# Patient Record
Sex: Female | Born: 1995 | Race: White | Hispanic: No | Marital: Single | Smoking: Current every day smoker
Health system: Southern US, Community
[De-identification: ages and names within clinical notes are randomized; demographics above are authoritative.]

## PROBLEM LIST (undated history)

## (undated) DIAGNOSIS — Q249 Congenital malformation of heart, unspecified: Secondary | ICD-10-CM

## (undated) DIAGNOSIS — M419 Scoliosis, unspecified: Secondary | ICD-10-CM

## (undated) DIAGNOSIS — G43909 Migraine, unspecified, not intractable, without status migrainosus: Secondary | ICD-10-CM

## (undated) DIAGNOSIS — E559 Vitamin D deficiency, unspecified: Secondary | ICD-10-CM

## (undated) DIAGNOSIS — M26609 Unspecified temporomandibular joint disorder, unspecified side: Secondary | ICD-10-CM

## (undated) DIAGNOSIS — L309 Dermatitis, unspecified: Secondary | ICD-10-CM

## (undated) DIAGNOSIS — F419 Anxiety disorder, unspecified: Secondary | ICD-10-CM

## (undated) DIAGNOSIS — N390 Urinary tract infection, site not specified: Secondary | ICD-10-CM

## (undated) DIAGNOSIS — L709 Acne, unspecified: Secondary | ICD-10-CM

## (undated) HISTORY — DX: Scoliosis, unspecified: M41.9

## (undated) HISTORY — DX: Unspecified temporomandibular joint disorder, unspecified side: M26.609

## (undated) HISTORY — DX: Vitamin D deficiency, unspecified: E55.9

## (undated) HISTORY — DX: Dermatitis, unspecified: L30.9

## (undated) HISTORY — DX: Migraine, unspecified, not intractable, without status migrainosus: G43.909

## (undated) HISTORY — DX: Urinary tract infection, site not specified: N39.0

## (undated) HISTORY — PX: CARDIAC SURGERY: SHX584

## (undated) HISTORY — PX: CARDIAC CATHETERIZATION: SHX172

## (undated) HISTORY — DX: Anxiety disorder, unspecified: F41.9

## (undated) HISTORY — DX: Congenital malformation of heart, unspecified: Q24.9

## (undated) HISTORY — DX: Acne, unspecified: L70.9

## (undated) HISTORY — PX: BACK SURGERY: SHX140

---
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
## 2019-01-21 ENCOUNTER — Other Ambulatory Visit (HOSPITAL_COMMUNITY): Payer: Self-pay | Attending: Psychiatry | Admitting: Licensed Clinical Social Worker

## 2019-01-21 ENCOUNTER — Other Ambulatory Visit: Payer: Self-pay

## 2019-01-21 DIAGNOSIS — F331 Major depressive disorder, recurrent, moderate: Secondary | ICD-10-CM

## 2019-01-23 NOTE — Psych (Signed)
Angela Marsh is a 23 y.o. female patient presenting for intensive treatment. Cln met with pt to determine if intensive treatment is recommended and which group would be best.  Pt states her therapist, Gwen Awel, recommended she engage in intensive treatment to address increasing depression. Pt states "I think it was just a bad day when she saw me." Pt shares she had just gotten back from a spring break trip to the beach with her friends and was experiencing post-vacation slump. Pt reports "the weird thing is, I've felt a lot better since she told me I needed this." Pt reports depression has been increasing for the past 6 months - 1 year due to being diagnosed with TMJ. Pt reports stressors of pain from TMJ, school,  and her family. Pt reports struggling in college and moving from [facility] to [place]. Pt states feeling like a failure because she is not as high achieving as other family members. Pt reports smoking marijuana and drinking alcohol. Pt states this is and is not problematic, switching throughout the discussion. Pt reports currently smoking 1/2 a blunt daily and 1 beer 3x/week. Pt states she does not want to stop. Pt denies self harm, stating she cut one time in middle school and never has again. Pt reports some passive SI over the past 6 months, the last time one month ago, and denies ever having intent or making a plan. Pt shares she feels her social anxiety is one of her biggest concerns and that she only has 1-3 friends which is not enough.  Pt reports "I don't know if I need this. I think I might be fine" however states she is willing to try anything. Cln educated pt on PHP, IOP, and CDIOP. Pt declines CDIOP stating she does not want to stop using. Pt declines PHP stating "I don't need that much" and reports interest in IOP.  Cln educates pt on the basics of IOP and gives pt start date of 01/24/19. Pt denies SI/HI/AVH.      Lorin Glass, LCSW

## 2019-03-13 ENCOUNTER — Telehealth: Payer: Self-pay | Admitting: *Deleted

## 2019-03-13 NOTE — Telephone Encounter (Signed)
Called pt & LVM asking for call back before her appt tomorrow with Dr. Jaynee Eagles. Left office number in message.   Need to update her chart.

## 2019-03-14 ENCOUNTER — Ambulatory Visit (INDEPENDENT_AMBULATORY_CARE_PROVIDER_SITE_OTHER): Payer: 59 | Admitting: Neurology

## 2019-03-14 ENCOUNTER — Encounter: Payer: Self-pay | Admitting: *Deleted

## 2019-03-14 ENCOUNTER — Other Ambulatory Visit: Payer: Self-pay

## 2019-03-14 DIAGNOSIS — G500 Trigeminal neuralgia: Secondary | ICD-10-CM | POA: Diagnosis not present

## 2019-03-14 DIAGNOSIS — G43109 Migraine with aura, not intractable, without status migrainosus: Secondary | ICD-10-CM | POA: Diagnosis not present

## 2019-03-14 DIAGNOSIS — R51 Headache: Secondary | ICD-10-CM

## 2019-03-14 DIAGNOSIS — R202 Paresthesia of skin: Secondary | ICD-10-CM

## 2019-03-14 DIAGNOSIS — R519 Headache, unspecified: Secondary | ICD-10-CM

## 2019-03-14 DIAGNOSIS — G509 Disorder of trigeminal nerve, unspecified: Secondary | ICD-10-CM

## 2019-03-14 DIAGNOSIS — R200 Anesthesia of skin: Secondary | ICD-10-CM

## 2019-03-14 DIAGNOSIS — H5461 Unqualified visual loss, right eye, normal vision left eye: Secondary | ICD-10-CM | POA: Diagnosis not present

## 2019-03-14 NOTE — Addendum Note (Signed)
Addended by: Gildardo Griffes on: 03/14/2019 11:55 AM   Modules accepted: Orders

## 2019-03-14 NOTE — Telephone Encounter (Signed)
Pt returned my call. Confirmed pt using 2 identifiers. Pt's chart updated including PMH, social documentation, medications. Pt understands she will receive a call to check in 30 minutes prior to appointment.

## 2019-03-14 NOTE — Progress Notes (Addendum)
[facility]    Provider:  Dr Jaynee Eagles Requesting Provider: Rodney Booze, MD Primary Care Provider:  Rodney Booze, MD  CC:  Migraines  Addendum: MRI of the brain with trigem protocol showed a likely vascular loop compression will send to Neurosurgery for second opinion.  MRI brain 04/06/2019: This MRI of the face and trigeminal nerves with and without contrast shows the following: 1.   The facial nerves appear normal and there is no distortion of the dorsal root entry zones.  The pons appears normal.  A vascular structure passes superior to and possibly contacts the right trigeminal nerve, without causing distortion of the nerve. 2.   Maxillary and mild ethmoid chronic sinusitis. 3.   There are no acute findings and there is a normal enhancement pattern.  Virtual Visit via Video Note  I connected with@ on 03/17/19 at  1:00 PM EDT by a video enabled telemedicine application and verified that I am speaking with the correct person using two identifiers. Patient is at home and physician is in the office.   I discussed the limitations of evaluation and management by telemedicine and the availability of in person appointments. The patient expressed understanding and agreed to proceed.  Melvenia Beam, MD  HPI:  Angela Marsh is a 23 y.o. female here as requested by Rodney Booze, MD for migraines and facial pain.  Past medical history migraines, truncus arteriosus and multiple heart surgeries, spinal fusion for scoliosis.  She has chronic TMJ and tried guards. Tingling (points to V1 and v2 distro) and radiates to the ears and back of the head and her feels like it is pulling with blurring and vision changes in the right eye.  Tingling is constant. Worsened with her last bite guard, discussed to stop using the bite guard for a few weeks. Sharp pains while eating. The sensory changes started in February and march. No light or sound sensitivity, no nausea. The  tingling and numbness on the face is continuous and waxes and wanes as far as the pain goes, chewing or movement of the jaw worsens it  but not standing or walking around. She is mostly concerned about her facial pain. Triggered by chewing. Discussed trigeminal neuralgi vs trigaminal neuropathy. No facial weakness. No other focal neurologic deficits, associated symptoms, inciting events or modifiable factors.  Her migraines are different.  They are sporadic may be necessitating medication 1 time a month.  Developed late in childhood.  Pain is bilateral, occipital and temporal.  Aching and pulsating.  Moderate pain.  Does include blurred vision, nausea, photophobia and visual change.  The visual changes zigzags lasting about 30 minutes and headaches will last for hours to 2 to 3 days.  Family history of migraine in mother.  She has had truncus arteriosus and multiple heart surgeries and a spinal fusion for scoliosis.  Reviewed notes, labs and imaging from outside physicians, which showed:  Reviewed Neurology notes from Dr. Sima Matas, headache specialist. She was first seen in 09/2018.  At that time she reported to mild headache days in the past 4 weeks and 1 headache day in the past 4 weeks requiring medication.  Overall 26 headache free days.  She was diagnosed with menstrually related migraines.  Trial of Maxalt.  Trial of gabapentin for jaw pain.  She tried the following medications for her migraines: Maxalt, aspirin, baclofen, Lamictal (was on for mood), Zoloft, Abilify, Wellbutrin.  Review of Systems: Patient complains of symptoms per HPI as well as the  following symptoms: migraine, facial pain. Pertinent negatives and positives per HPI. All others negative.   Social History   Socioeconomic History   Marital status: Single    Spouse name: Not on file   Number of children: 0   Years of education: working on bachelor's degree   Highest education level: High school graduate  Occupational  History   Not on file  Social Needs   Financial resource strain: Not on file   Food insecurity:    Worry: Not on file    Inability: Not on file   Transportation needs:    Medical: Not on file    Non-medical: Not on file  Tobacco Use   Smoking status: Never Smoker   Smokeless tobacco: Never Used  Substance and Sexual Activity   Alcohol use: Yes    Alcohol/week: 4.0 standard drinks    Types: 4 Standard drinks or equivalent per week   Drug use: Yes    Frequency: 4.0 times per week    Types: Marijuana   Sexual activity: Never    Birth control/protection: I.U.D.  Lifestyle   Physical activity:    Days per week: Not on file    Minutes per session: Not on file   Stress: Not on file  Relationships   Social connections:    Talks on phone: Not on file    Gets together: Not on file    Attends religious service: Not on file    Active member of club or organization: Not on file    Attends meetings of clubs or organizations: Not on file    Relationship status: Not on file   Intimate partner violence:    Fear of current or ex partner: Not on file    Emotionally abused: Not on file    Physically abused: Not on file    Forced sexual activity: Not on file  Other Topics Concern   Not on file  Social History Narrative   Lives at home with her parents   Right handed   Caffeine: 1-2 cups per day    Family History  Problem Relation Age of Onset   Endometriosis Mother    Rheum arthritis Mother    Migraines Mother    Breast cancer Other        grandmother    Past Medical History:  Diagnosis Date   Acne    Anxiety    Eczema    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)   Migraine syndrome    Scoliosis 2011   TMJ (temporomandibular joint disorder)    UTI (urinary tract infection)    Vitamin D deficiency     Patient Active Problem List   Diagnosis Date Noted   Migraine with aura and without status migrainosus, not  intractable 03/17/2019   Facial pain 03/17/2019   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     baclofen (LIORESAL) 10 MG tablet Take 10 mg by mouth daily as needed.     gabapentin (NEURONTIN) 300 MG capsule Take 600 mg by  mouth at bedtime. May take three capsules during the day as needed     ibuprofen (ADVIL) 200 MG tablet as needed.      rizatriptan (MAXALT) 10 MG tablet      No current facility-administered medications for this visit.     Allergies as of 03/14/2019   (No Known Allergies)    Vitals: LMP 02/25/2019 (Within Weeks)  Last Weight:  Wt Readings from Last 1 Encounters:  03/14/19 133 lb (60.3 kg)   Last Height:   Ht Readings from Last 1 Encounters:  03/14/19 5\' 3"  (1.6 m)     Physical exam: Exam:    Physical exam: Exam: Gen: NAD, conversant      CV: attempted, Could not perform over Web Video. Denies palpitations or chest pain or SOB. VS: Breathing at a normal rate. Weight appears within normal limits. Not febrile. Eyes: Conjunctivae clear without exudates or hemorrhage  Neuro: Detailed Neurologic Exam  Speech:    Speech is normal; fluent and spontaneous with normal comprehension.  Cognition:    The patient is oriented to person, place, and time;     recent and remote memory intact;     language fluent;     normal attention, concentration,     fund of knowledge Cranial Nerves:    The pupils are equal, round, and reactive to light. Attempted, Cannot perform fundoscopic exam. Visual fields are full to finger confrontation. Extraocular movements are intact.  The face is symmetric with normal sensation. The palate  elevates in the midline. Hearing intact. Voice is normal. Shoulder shrug is normal. The tongue has normal motion without fasciculations.   Coordination:    Normal finger to nose  Gait:    Normal native gait  Motor Observation:   no involuntary movements noted. Tone:    Appears normal  Posture:    Posture is normal. normal erect    Strength:    Strength is anti-gravity and symmetric in the upper and lower limbs.      Sensation: intact to LT     Reflex Exam:  DTR's:    Attempted, Could not perform over Web Video   Toes: Attempted Could not perform over Web Video  Clonus:   Attempted, Could not perform over Web Video     Assessment/Plan:  23 y.o. female here as requested by Rodney Booze, MD for migraines and facial pain.  Past medical history migraines, truncus arteriosus and multiple heart surgeries, spinal fusion for scoliosis.  She has chronic TMJ and tried guards. Tingling (points to V1 and v2 distro).  Addendum: MRI of the brain with trigem protocol showed a likely vascular loop compression will send to Neurosurgery for second opinion.  MRI brain 04/06/2019: This MRI of the face and trigeminal nerves with and without contrast shows the following: 1.   The facial nerves appear normal and there is no distortion of the dorsal root entry zones.  The pons appears normal.  A vascular structure passes superior to and possibly contacts the right trigeminal nerve, without causing distortion of the nerve. 2.   Maxillary and mild ethmoid chronic sinusitis. 3.   There are no acute findings and there is a normal enhancement pattern.  PRIOR A/P:   Will order an MRI of the face w/wo contrast trigeminal protocol  to evaluate for possible ischaemic or haemorrhagic lesions of the brainstem that can cause isolated cranial nerve palsies; small lesions of the brainstem can affect the trigeminal nerve only. Other causes include including vascular loop  compression, inflammatory,  demyelinating such as multiple sclerosis, infectious, and neoplastic  Migraines: episodic, maxalt prn  Discussed above at length with patient. Continue Gabapentin for trigeminal nerve pain.  Discussed: There is increased risk for stroke in women with migraine with aura and a contraindication for the combined contraceptive pill for use by women who have migraine with aura. The risk for women with migraine without aura is lower. However other risk factors like smoking are far more likely to increase stroke risk than migraine. There is a recommendation for no smoking and for the use of OCPs without estrogen such as progestogen only pills particularly for women with migraine with aura.Marland Kitchen People who have migraine headaches with auras may be 3 times more likely to have a stroke caused by a blood clot, compared to migraine patients who don't see auras. Women who take hormone-replacement therapy may be 30 percent more likely to suffer a clot-based stroke than women not taking medication containing estrogen. Other risk factors like smoking and high blood pressure may be  much more important.  Orders Placed This Encounter  Procedures   MR FACE/TRIGEMINAL WO/W CM     Discussed: To prevent or relieve headaches, try the following: Cool Compress. Lie down and place a cool compress on your head.  Avoid headache triggers. If certain foods or odors seem to have triggered your migraines in the past, avoid them. A headache diary might help you identify triggers.  Include physical activity in your daily routine. Try a daily walk or other moderate aerobic exercise.  Manage stress. Find healthy ways to cope with the stressors, such as delegating tasks on your to-do list.  Practice relaxation techniques. Try deep breathing, yoga, massage and visualization.  Eat regularly. Eating regularly scheduled meals and maintaining a healthy diet might help prevent headaches. Also, drink plenty of fluids.  Follow a regular sleep  schedule. Sleep deprivation might contribute to headaches Consider biofeedback. With this mind-body technique, you learn to control certain bodily functions -- such as muscle tension, heart rate and blood pressure -- to prevent headaches or reduce headache pain.    Proceed to emergency room if you experience new or worsening symptoms or symptoms do not resolve, if you have new neurologic symptoms or if headache is severe, or for any concerning symptom.   Provided education and documentation from American headache Society toolbox including articles on: chronic migraine medication overuse headache, chronic migraines, prevention of migraines, behavioral and other nonpharmacologic treatments for headache.   Follow Up Instructions:    I discussed the assessment and treatment plan with the patient. The patient was provided an opportunity to ask questions and all were answered. The patient agreed with the plan and demonstrated an understanding of the instructions.   The patient was advised to call back or seek an in-person evaluation if the symptoms worsen or if the condition fails to improve as anticipated.  A total of 80 minutes was spent in the care of this patient, utilizing Video face-to-face(not in person) with this patient. Over half this time was spent on counseling patient on the  1. Trigeminal neuralgia   2. Facial pain   3. Numbness and tingling of right face   4. Vision loss of right eye   5. Migraine with aura and without status migrainosus, not intractable   6. Trigeminal neuropathy    diagnosis and different diagnostic and therapeutic options, counseling and coordination of care, risks ans benefits of management, compliance, or risk factor reduction and education.  Cc: Rodney Booze, MD,    Sarina Ill, MD  [facility] [address] [place], [place] [address]  Phone [phone] Fax [phone]

## 2019-03-17 ENCOUNTER — Encounter: Payer: Self-pay | Admitting: Neurology

## 2019-03-17 DIAGNOSIS — R519 Headache, unspecified: Secondary | ICD-10-CM | POA: Insufficient documentation

## 2019-03-17 DIAGNOSIS — G43109 Migraine with aura, not intractable, without status migrainosus: Secondary | ICD-10-CM | POA: Insufficient documentation

## 2019-03-17 DIAGNOSIS — G509 Disorder of trigeminal nerve, unspecified: Secondary | ICD-10-CM | POA: Insufficient documentation

## 2019-03-21 ENCOUNTER — Telehealth: Payer: Self-pay | Admitting: Neurology

## 2019-03-21 NOTE — Telephone Encounter (Signed)
[employer] Auth: [id number] (exp. 03/21/19 to 05/05/19) order sent to GI. They will reach out to the pt to schedule.

## 2019-04-06 ENCOUNTER — Other Ambulatory Visit: Payer: Self-pay

## 2019-04-06 ENCOUNTER — Ambulatory Visit
Admission: RE | Admit: 2019-04-06 | Discharge: 2019-04-06 | Disposition: A | Discharge status: Home / Self Care | Payer: 59 | Source: Ambulatory Visit | Attending: Neurology | Admitting: Neurology

## 2019-04-06 DIAGNOSIS — H5461 Unqualified visual loss, right eye, normal vision left eye: Secondary | ICD-10-CM

## 2019-04-06 DIAGNOSIS — R202 Paresthesia of skin: Secondary | ICD-10-CM

## 2019-04-06 DIAGNOSIS — R200 Anesthesia of skin: Secondary | ICD-10-CM

## 2019-04-06 DIAGNOSIS — G500 Trigeminal neuralgia: Secondary | ICD-10-CM

## 2019-04-06 DIAGNOSIS — R519 Headache, unspecified: Secondary | ICD-10-CM

## 2019-04-06 DIAGNOSIS — R51 Headache: Secondary | ICD-10-CM

## 2019-04-06 MED ORDER — GADOBENATE DIMEGLUMINE 529 MG/ML IV SOLN
12.0000 mL | Freq: Once | INTRAVENOUS | Status: AC | PRN
  Administered 2019-04-06: 12 mL via INTRAVENOUS

## 2019-04-08 ENCOUNTER — Other Ambulatory Visit: Payer: Self-pay | Admitting: Neurology

## 2019-04-08 ENCOUNTER — Telehealth: Payer: Self-pay | Admitting: Neurology

## 2019-04-08 DIAGNOSIS — G500 Trigeminal neuralgia: Secondary | ICD-10-CM

## 2019-04-08 NOTE — Telephone Encounter (Signed)
I called patient to discuss MRI of the trigeminal nerve. Appears there may be a blood vessel touching the nerve and this can be a common cause of trigeminal nerve pain. I left her a message. We can send her to neurosurgery for evaluation, this can be fixed surgically. I left a message.  Angela Marsh would you call her again and make sure she got the message or if she has questions? If she is ok with it I can send her for an evaluation to neurosurgery to see what they think and they can discuss with her. Thanks.

## 2019-04-08 NOTE — Telephone Encounter (Signed)
Noted thanks °

## 2019-04-08 NOTE — Telephone Encounter (Signed)
Pt returned Dr. Cathren Laine call. She had received the message left. She is agreeable to being referred to neurosurgery to discuss if the vessel/nerve issue can be fixed surgically. She will discuss with her parents in the meantime. She understands the referral will be placed and the neurosurgery office will call her to schedule. She verbalized appreciation and her questions were answered. She has a physician that she would like to see the MRI however she will call back if she needs anything sent on our end.

## 2019-04-08 NOTE — Telephone Encounter (Signed)
Thanks, I placed it fyi

## 2019-04-15 ENCOUNTER — Telehealth: Payer: Self-pay | Admitting: Neurology

## 2019-04-15 NOTE — Telephone Encounter (Signed)
We can increase her gabapentin if she is willing or switch to Lyrica. If she is willing please verify dose she is taking of Gabapentin (at night or 3x a day?) thanks

## 2019-04-15 NOTE — Telephone Encounter (Signed)
Pt called wanting to know if she can be put on some pain medication until she sees the Neuro surgeon. Please advise.

## 2019-04-16 NOTE — Telephone Encounter (Signed)
I like tegretol a lot. Can you ask her to email me so we can discuss it? But with tegretol you have to make sure not to get pregnant, it is teratogenic and it interferes with birth control. But Tegretol is first line therapy for trigeminal neuralgia. thanls

## 2019-04-16 NOTE — Telephone Encounter (Signed)
Spoke with patient and discussed Dr. Cathren Laine message re: Tegretol. Pt verbalized understanding of request for pt to message Dr. Jaynee Eagles through [place] to discuss. Pt aware no other prescriptions have been sent at this time. She verbalized appreciation for the call.

## 2019-04-16 NOTE — Telephone Encounter (Signed)
Spoke with a patient. She stated she usually takes 2 capsules of gabapentin at night and 2-3 during the day. She isn't sure how about increasing the dose. Stated she stopped it one day and had "withdrawals". I asked her about switching to Lyrica. She will talk to her mother and call us back. Also, she saw something about Tegretol and wondered if Dr. Jaynee Eagles had mentioned it.

## 2019-04-16 NOTE — Telephone Encounter (Signed)
Patient called back. She would like to switch to Lyrica. She still would like Dr. Jaynee Eagles to be asked about Tegretol.

## 2019-04-19 ENCOUNTER — Other Ambulatory Visit: Payer: Self-pay | Admitting: Neurology

## 2019-04-19 MED ORDER — CARBAMAZEPINE ER 100 MG PO TB12: 100.0000 mg | ORAL_TABLET | Freq: Two times a day (BID) | ORAL | 3 refills | Status: DC

## 2019-10-10 ENCOUNTER — Other Ambulatory Visit: Payer: Self-pay | Admitting: Neurology

## 2019-10-13 ENCOUNTER — Other Ambulatory Visit: Payer: Self-pay | Admitting: Neurology

## 2019-10-13 MED ORDER — CARBAMAZEPINE ER 100 MG PO TB12: 100.0000 mg | ORAL_TABLET | Freq: Two times a day (BID) | ORAL | 1 refills | Status: DC

## 2019-11-18 ENCOUNTER — Encounter: Payer: Self-pay | Admitting: Surgery

## 2019-11-18 ENCOUNTER — Other Ambulatory Visit: Payer: Self-pay

## 2019-11-18 ENCOUNTER — Ambulatory Visit (INDEPENDENT_AMBULATORY_CARE_PROVIDER_SITE_OTHER): Payer: 59 | Admitting: Surgery

## 2019-11-18 ENCOUNTER — Ambulatory Visit (HOSPITAL_COMMUNITY)
Admission: RE | Admit: 2019-11-18 | Discharge: 2019-11-18 | Disposition: A | Discharge status: Home / Self Care | Payer: 59 | Source: Ambulatory Visit | Attending: Surgery | Admitting: Surgery

## 2019-11-18 VITALS — BP 101/62 | HR 76 | Temp 97.7°F | Resp 20 | Ht 63.0 in | Wt 149.0 lb

## 2019-11-18 DIAGNOSIS — M79603 Pain in arm, unspecified: Secondary | ICD-10-CM | POA: Diagnosis present

## 2019-11-18 DIAGNOSIS — I808 Phlebitis and thrombophlebitis of other sites: Secondary | ICD-10-CM

## 2019-11-18 NOTE — Progress Notes (Signed)
[facility] [place]  Patient name: Angela Marsh MRN: [id number] DOB: [birth year] Sex: female   REQUESTING PROVIDER:    Dr. Gwenlyn Found   REASON FOR CONSULT:    Right external jugular vein thrombus  HISTORY OF PRESENT ILLNESS:   Angela Marsh is a 24 y.o. 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  Her most recent surgery was in 2016.  She last saw her cardiologist on November 13, 2019.  At that time a right neck superficial mass  was identified, which had been present for 1-2 weeks.  It was felt that this was a superficial thrombophlebitis.  The etiology is unclear.  Presumably she has had central lines on that side in the past.  She denies any history of trauma.  She has been treating this with warm compresses.  It has gotten somewhat smaller but remains slightly tender.  PAST MEDICAL HISTORY    Past Medical History:  Diagnosis Date  . Acne   . Anxiety   . Eczema   . 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 Migraine syndrome   . Scoliosis 2011  . TMJ (temporomandibular joint disorder)   . UTI (urinary tract infection)   . Vitamin D deficiency      FAMILY HISTORY   Family History  Problem Relation Age of Onset  . Endometriosis Mother   . Rheum arthritis Mother   . Migraines Mother   . Breast cancer Other        grandmother    SOCIAL HISTORY:   Social History   Socioeconomic History  . Marital status: Single    Spouse name: Not on file  . Number of children: 0  . Years of education: working on bachelor's degree  . Highest education level: High school graduate  Occupational History  . Not on file  Tobacco Use  . Smoking status: Never Smoker  . Smokeless tobacco: Never Used  Substance and Sexual Activity  . Alcohol use: Yes    Alcohol/week: 4.0 standard drinks    Types: 4 Standard drinks or equivalent per week  . Drug use: Yes   Frequency: 4.0 times per week    Types: Marijuana  . Sexual activity: Never    Birth control/protection: I.U.D.  Other Topics Concern  . Not on file  Social History Narrative   Lives at home with her parents   Right handed   Caffeine: 1-2 cups per day   Social Determinants of Health   Financial Resource Strain:   . Difficulty of Paying Living Expenses: Not on file  Food Insecurity:   . Worried About Charity fundraiser in the Last Year: Not on file  . Ran Out of Food in the Last Year: Not on file  Transportation Needs:   . Lack of Transportation (Medical): Not on file  . Lack of Transportation (Non-Medical): Not on file  Physical Activity:   . Days of Exercise per Week: Not on file  . Minutes of Exercise per Session: Not on file  Stress:   . Feeling of Stress : Not on file  Social Connections:   . Frequency of Communication with Friends and Family: Not on file  . Frequency of Social Gatherings with Friends and Family: Not on file  . Attends Religious Services: Not on file  . Active Member of Clubs or Organizations: Not on  file  . Attends Archivist Meetings: Not on file  . Marital Status: Not on file  Intimate Partner Violence:   . Fear of Current or Ex-Partner: Not on file  . Emotionally Abused: Not on file  . Physically Abused: Not on file  . Sexually Abused: Not on file    ALLERGIES:    No Known Allergies  CURRENT MEDICATIONS:    Current Outpatient Medications  Medication Sig Dispense Refill  . acetaminophen (TYLENOL) 500 MG tablet Take 1,000 mg by mouth as needed.    . baclofen (LIORESAL) 10 MG tablet Take 10 mg by mouth daily as needed.    . carbamazepine (TEGRETOL XR) 100 MG 12 hr tablet Take 1 tablet (100 mg total) by mouth 2 (two) times daily. 180 tablet 1  . gabapentin (NEURONTIN) 300 MG capsule Take 600 mg by mouth at bedtime. May take three capsules during the day as needed    . ibuprofen (ADVIL) 200 MG tablet as needed.     . rizatriptan  (MAXALT) 10 MG tablet      No current facility-administered medications for this visit.    REVIEW OF SYSTEMS:   [X]  denotes positive finding, [ ]  denotes negative finding Cardiac  Comments:  Chest pain or chest pressure:    Shortness of breath upon exertion:    Short of breath when lying flat:    Irregular heart rhythm:        Vascular    Pain in calf, thigh, or hip brought on by ambulation:    Pain in feet at night that wakes you up from your sleep:     Blood clot in your veins:    Leg swelling:         Pulmonary    Oxygen at home:    Productive cough:     Wheezing:         Neurologic    Sudden weakness in arms or legs:     Sudden numbness in arms or legs:     Sudden onset of difficulty speaking or slurred speech:    Temporary loss of vision in one eye:     Problems with dizziness:         Gastrointestinal    Blood in stool:      Vomited blood:         Genitourinary    Burning when urinating:     Blood in urine:        Psychiatric    Major depression:         Hematologic    Bleeding problems:    Problems with blood clotting too easily:        Skin    Rashes or ulcers:        Constitutional    Fever or chills:     PHYSICAL EXAM:   There were no vitals filed for this visit.  GENERAL: The patient is a well-nourished female, in no acute distress. The vital signs are documented above. CARDIAC: There is a regular rate and rhythm.  PULMONARY: Nonlabored respirations NEUROLOGIC: No focal weakness or paresthesias are detected. SKIN: There are no ulcers or rashes noted. PSYCHIATRIC: The patient has a normal affect. I evaluated the area with the SonoSite.  A circular shaped noncompressible structure is identified.  I cannot link this to another surrounding vein. STUDIES:   I have ordered and reviewed the following venous duplex: Right: No evidence of deep vein thrombosis in the upper extremity. No evidence  of superficial vein thrombosis in the upper  extremity. No evidence of thrombosis in the subclavian. Small anechoic cyst like structure seen at symptomatic area.   Left: No evidence of deep vein thrombosis in the upper extremity. No evidence of superficial vein thrombosis in the upper extremity. No evidence of thrombosis in the subclavian.   At symptomatic area of right neck there was a small, well defined, anechoic structure seen measuring 0.4 x 0.5 x 1.0 cm. No flow was identified within     ASSESSMENT and PLAN   Based off ultrasound evaluation, this has the appearance of a superficial thrombophlebitis, however by ultrasound is difficult to get this area to connect to a patent vein.  Fortunately, there is nothing involving the deep venous system.  At this point my suspicion is that this is a superficial thrombophlebitis for which she should continue treating this with warm compresses.  She would benefit from taking aspirin for 2 to 4 weeks and then discontinuing it.  I told her to reach out to me again in a month if this has not completely resolved.  This could be excised if necessary.   Leia Alf, MD, FACS [facility] [phone] Pager [phone]

## 2021-12-02 ENCOUNTER — Ambulatory Visit: Payer: BC Managed Care – PPO | Admitting: Neurology

## 2021-12-15 ENCOUNTER — Ambulatory Visit: Payer: BC Managed Care – PPO | Admitting: Neurology

## 2021-12-15 ENCOUNTER — Encounter: Payer: Self-pay | Admitting: Neurology

## 2021-12-15 VITALS — BP 119/77 | HR 104 | Ht 64.0 in | Wt 122.0 lb

## 2021-12-15 DIAGNOSIS — G509 Disorder of trigeminal nerve, unspecified: Secondary | ICD-10-CM

## 2021-12-15 DIAGNOSIS — M26623 Arthralgia of bilateral temporomandibular joint: Secondary | ICD-10-CM

## 2021-12-15 DIAGNOSIS — G501 Atypical facial pain: Secondary | ICD-10-CM

## 2021-12-15 MED ORDER — PREGABALIN 50 MG PO CAPS: 50.0000 mg | ORAL_CAPSULE | Freq: Two times a day (BID) | ORAL | 2 refills | Status: AC

## 2021-12-15 NOTE — Progress Notes (Signed)
[facility]    Provider:  Dr Jaynee Eagles Requesting Provider: Darrol Jump, MD Primary Care Provider:  Glenis Smoker, MD  CC:  Migraines  12/15/2021: Haven't seen patient since 03/2019: Here for trigeminal neuralgia. Tried lyrica, tegretol, gabapentin, baclofen, elavil, prozac. She has pain in her head, tingling on the head, pressure on the head. 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 She may have a migrane once a month but the daily symptoms are more due to the jaw pain. They have seen multiple oral surgeons, been to [facility], she has been placed in braces. Going to [facility] pain therapy Lucius Conn.   Addendum: MRI of the brain with trigem protocol showed a likely vascular loop compression will send to Neurosurgery for second opinion.  MRI brain 04/06/2019: This MRI of the face and trigeminal nerves with and without contrast shows the following: 1.   The facial nerves appear normal and there is no distortion of the dorsal root entry zones.  The pons appears normal.  A vascular structure passes superior to and possibly contacts the right trigeminal nerve, without causing distortion of the nerve. 2.   Maxillary and mild ethmoid chronic sinusitis. 3.   There are no acute findings and there is a normal enhancement pattern.  HPI:  Angela Marsh is a 26 y.o. female here as requested by Darrol Jump, MD for migraines and facial pain.  Past medical history migraines, truncus arteriosus and multiple heart surgeries, spinal fusion for scoliosis.  She has chronic TMJ and tried guards. Tingling (points to V1 and v2 distro) and radiates to the ears and back of the head and her feels like it is pulling with blurring and vision changes in the right eye.  Tingling is constant. Worsened with her last bite guard, discussed to stop using the bite guard for a few weeks. Sharp pains while eating. The sensory changes started in February  and march. No light or sound sensitivity, no nausea. The tingling and numbness on the face is continuous and waxes and wanes as far as the pain goes, chewing or movement of the jaw worsens it  but not standing or walking around. She is mostly concerned about her facial pain. Triggered by chewing. Discussed trigeminal neuralgi vs trigaminal neuropathy. No facial weakness. No other focal neurologic deficits, associated symptoms, inciting events or modifiable factors.  Her migraines are different.  They are sporadic may be necessitating medication 1 time a month.  Developed late in childhood.  Pain is bilateral, occipital and temporal.  Aching and pulsating.  Moderate pain.  Does include blurred vision, nausea, photophobia and visual change.  The visual changes zigzags lasting about 30 minutes and headaches will last for hours to 2 to 3 days.  Family history of migraine in mother.  She has had truncus arteriosus and multiple heart surgeries and a spinal fusion for scoliosis.  Reviewed notes, labs and imaging from outside physicians, which showed:  Reviewed Neurology notes from Dr. Sima Matas, headache specialist. She was first seen in 09/2018.  At that time she reported to mild headache days in the past 4 weeks and 1 headache day in the past 4 weeks requiring medication.  Overall 26 headache free days.  She was diagnosed with menstrually related migraines.  Trial of Maxalt.  Trial of gabapentin for jaw pain.  She tried the following medications for her migraines: Maxalt, aspirin, baclofen, Lamictal (was on for mood), Zoloft, Abilify, Wellbutrin.  From [facility]:  Current and past medications: ANALGESICS: aspirin ANTI-MIGRAINE: maxalt HEART/BP:  DECONGESTANT/ANTIHISTAMINE: ANTI-NAUSEANT NSAIDS: MUSCLE RELAXANTS: baclofen ANTI-CONVULSANTS: lamictal, gabapentin, tegretol STEROIDS: SLEEPING PILLS/TRANQUILIZERS: xanax ANTI-DEPRESSANTS: zoloft, abilify, wellbutrin, duloxetine OTHER:  PROCEDURES FOR  HEADACHES:  Review of Systems: Patient complains of symptoms per HPI as well as the following symptoms: TMJ . Pertinent negatives and positives per HPI. All others negative    Social History   Socioeconomic History   Marital status: Single    Spouse name: Not on file   Number of children: 0   Years of education: working on bachelor's degree   Highest education level: High school graduate  Occupational History   Not on file  Tobacco Use   Smoking status: Every Day    Types: Cigarettes   Smokeless tobacco: Never   Tobacco comments:    Nicotine vaping   Vaping Use   Vaping Use: Every day   Substances: Nicotine  Substance and Sexual Activity   Alcohol use: Yes    Alcohol/week: 4.0 standard drinks    Types: 4 Standard drinks or equivalent per week   Drug use: Yes    Frequency: 2.0 times per week    Types: Marijuana    Comment: delta 8; previously smoked marijuana   Sexual activity: Never    Birth control/protection: I.U.D.  Other Topics Concern   Not on file  Social History Narrative   Lives in an apartment by herself   Right handed   Caffeine: 1-2 cups per day   Social Determinants of Health   Financial Resource Strain: Not on file  Food Insecurity: Not on file  Transportation Needs: Not on file  Physical Activity: Not on file  Stress: Not on file  Social Connections: Not on file  Intimate Partner Violence: Not on file    Family History  Problem Relation Age of Onset   Endometriosis Mother    Rheum arthritis Mother    Migraines Mother    Breast cancer Other        grandmother    Past Medical History:  Diagnosis Date   Acne    Anxiety    Eczema    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)   Migraine syndrome    Scoliosis 2011   TMJ (temporomandibular joint disorder)    UTI (urinary tract infection)    Vitamin D deficiency     Patient Active Problem List   Diagnosis Date Noted   Bilateral temporomandibular joint pain  12/16/2021   Atypical facial pain 12/16/2021   Migraine with aura and without status migrainosus, not intractable 03/17/2019   Facial pain 03/17/2019   Ovarian cystic mass 11/16/2011    Past Surgical History:  Procedure Laterality Date   BACK SURGERY     scoliosis   CARDIAC CATHETERIZATION     x3   [place]   for truncus     Current Outpatient Medications  Medication Sig Dispense Refill   acetaminophen (TYLENOL) 500 MG tablet Take 1,000 mg by mouth as needed.     amitriptyline (ELAVIL) 10 MG tablet      baclofen (LIORESAL) 20 MG tablet Take 10 mg by mouth. 2 hours before bedtime.     clonazePAM (KLONOPIN) 1 MG tablet Take 0.5 mg by mouth at bedtime.     DULoxetine (CYMBALTA) 30 MG capsule      gabapentin (NEURONTIN) 300 MG capsule Take 600 mg by mouth  at bedtime. May take three capsules during the day as needed     ibuprofen (ADVIL) 200 MG tablet as needed.      Levonorgestrel (KYLEENA) 19.5 MG IUD Kyleena 17.5 mcg/24 hrs (51yrs) 19.5mg  intrauterine device  Take 1 device by intrauterine route.     pregabalin (LYRICA) 50 MG capsule Take 1 capsule (50 mg total) by mouth 2 (two) times daily. 60 capsule 2   rizatriptan (MAXALT) 10 MG tablet      No current facility-administered medications for this visit.    Allergies as of 12/15/2021   (No Known Allergies)    Vitals: BP 119/77 (BP Location: Right Arm, Patient Position: Sitting)    Pulse (!) 104    Ht 5\' 4"  (1.626 m)    Wt 122 lb (55.3 kg)    BMI 20.94 kg/m  Last Weight:  Wt Readings from Last 1 Encounters:  12/15/21 122 lb (55.3 kg)   Last Height:   Ht Readings from Last 1 Encounters:  12/15/21 5\' 4"  (1.626 m)   Physical exam: Exam: Gen: NAD, conversant, well nourised, obese, well groomed                     CV: RRR, no MRG. No Carotid Bruits. No peripheral edema, warm, nontender Eyes: Conjunctivae clear without exudates or hemorrhage Mouth: Pain on palpation of TMJ bilaterally  Neuro: Detailed  Neurologic Exam  Speech:    Speech is normal; fluent and spontaneous with normal comprehension.  Cognition:    The patient is oriented to person, place, and time;     recent and remote memory intact;     language fluent;     normal attention, concentration,     fund of knowledge Cranial Nerves:    The pupils are equal, round, and reactive to light. The fundi are flat. Visual fields are full to finger confrontation. Extraocular movements are intact. Trigeminal sensation is intact and the muscles of mastication are normal. The face is symmetric. The palate elevates in the midline. Hearing intact. Voice is normal. Shoulder shrug is normal. The tongue has normal motion without fasciculations.   Coordination:    Normal   Gait:    normal.   Motor Observation:    No asymmetry, no atrophy, and no involuntary movements noted. Tone:    Normal muscle tone.    Posture:    Posture is normal. normal erect    Strength:    Strength is V/V in the upper and lower limbs.      Sensation: intact to LT     Reflex Exam:  DTR's:    Deep tendon reflexes in the upper and lower extremities are normal bilaterally.   Toes:    The toes are downgoing bilaterally.   Clonus:    Clonus is absent.  Assessment/Plan:  26 y.o. female here as requested by Rodney Booze, MD for migraines and facial pain.  Past medical history migraines, truncus arteriosus and multiple heart surgeries, spinal fusion for scoliosis.  She has chronic TMJ and tried multiple medications, procedures see below. MRI of the brain with trigem protocol showed a likely vascular loop compression however her pain sounds more like TMJ as opposed to a trigeminal neuralgia pain, I do not think that decompression surgery will help and neurosurgery agreed.  - Haven't seen patient since 03/2019: Here for atypical facial pain that appears to ne more TMJ pain than trigeminal neuralgia. Tried lyrica, tegretol, gabapentin, baclofen, elavil, prozac.   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 She may have a migrane once a month but the daily symptoms are more due to the jaw pain. They have seen multiple oral surgeons, been to [facility], she has been placed in braces.   - Going to [facility] pain therapy Lucius Conn who has done an excellent job and I recommend she see this physician again; per my review of notes he has done an excellent job: he has counseled her on self-care of the jaw, conservative measures such as moist heat, a "pain-free diet", jaw positions to help with relaxing jaw muscles, avoiding caffeine, avoiding strain in the jaw muscles, avoiding resting jaw on the hand, or any activities that involve wide opening close parentheses; he has also advised her to try ibuprofen and Tylenol, recommended oral appliances such as a new maxillary stabilization appliance and provided adjustments, he has tried Klonopin, melatonin, magnesium, omega-3, complex B, alpha lipoic acid, physical therapy, trigger point injections and recommended Botox injections.  She has also tried Tegretol, Cymbalta, Lexapro, baclofen, Flexeril, gabapentin.  - I highly encouraged her to go forward with the Botox injections with Dr. Margreta Journey.  Also she is not taking her Celexa daily, I advised compliance with medications.  -Continue Maxalt for episodic migraines which works.  Addendum: MRI of the brain with trigem protocol showed a likely vascular loop compression will send to Neurosurgery for second opinion.  However her pain today sounds more like TMJ as opposed to a trigeminal neuralgia pain, I do not think that decompression surgery will help and neurosurgery agreed.  MRI brain 04/06/2019: This MRI of the face and trigeminal nerves with and without contrast shows the following: 1.   The facial nerves appear normal and there is no distortion of the dorsal root entry zones.  The pons appears normal.  A vascular structure  passes superior to and possibly contacts the right trigeminal nerve, without causing distortion of the nerve. 2.   Maxillary and mild ethmoid chronic sinusitis. 3.   There are no acute findings and there is a normal enhancement pattern.  Discussed: There is increased risk for stroke in women with migraine with aura and a contraindication for the combined contraceptive pill for use by women who have migraine with aura. The risk for women with migraine without aura is lower. However other risk factors like smoking are far more likely to increase stroke risk than migraine. There is a recommendation for no smoking and for the use of OCPs without estrogen such as progestogen only pills particularly for women with migraine with aura.Marland Kitchen People who have migraine headaches with auras may be 3 times more likely to have a stroke caused by a blood clot, compared to migraine patients who don't see auras. Women who take hormone-replacement therapy may be 30 percent more likely to suffer a clot-based stroke than women not taking medication containing estrogen. Other risk factors like smoking and high blood pressure may be  much more important.  Orders Placed This Encounter  Procedures   Ambulatory referral to Physical Therapy     Discussed: To prevent or relieve headaches, try the following: Cool Compress. Lie down and place a cool compress on your head.  Avoid headache triggers. If certain foods or odors seem to have triggered your migraines in the past, avoid them. A headache diary might help you identify triggers.  Include physical activity in your daily routine. Try a daily walk or other moderate aerobic exercise.  Manage  stress. Find healthy ways to cope with the stressors, such as delegating tasks on your to-do list.  Practice relaxation techniques. Try deep breathing, yoga, massage and visualization.  Eat regularly. Eating regularly scheduled meals and maintaining a healthy diet might help prevent  headaches. Also, drink plenty of fluids.  Follow a regular sleep schedule. Sleep deprivation might contribute to headaches Consider biofeedback. With this mind-body technique, you learn to control certain bodily functions -- such as muscle tension, heart rate and blood pressure -- to prevent headaches or reduce headache pain.    Proceed to emergency room if you experience new or worsening symptoms or symptoms do not resolve, if you have new neurologic symptoms or if headache is severe, or for any concerning symptom.   Provided education and documentation from American headache Society toolbox including articles on: chronic migraine medication overuse headache, chronic migraines, prevention of migraines, behavioral and other nonpharmacologic treatments for headache.   Cc: Darrol Jump, MD,    Sarina Ill, MD  [facility] [address] [place], [place] [address]  Phone [phone] Fax [phone]  I spent over 40 minutes of face-to-face and non-face-to-face time with patient on the  1. Bilateral temporomandibular joint pain   2. Atypical facial pain   3. Trigeminal neuropathy    diagnosis.  This included previsit chart review, lab review, study review, order entry, electronic health record documentation, patient education on the different diagnostic and therapeutic options, counseling and coordination of care, risks and benefits of management, compliance, or risk factor reduction

## 2021-12-15 NOTE — Patient Instructions (Addendum)
Start Lyrica 50mg  twice daily and in 2 weeks, Mychart me and we can increase to 100mg  twice daily and at that point I would want you to start titrating off of the Gabapentin start slowly decrease by one pill a week and when start weaning completely off we can increase Lyrica to 150mg  twice daily and even further.  Physical therapy - [facility]  On Cymbalta 30mg  a day can consider increasing to 60mg  a day and then 60mg  twice a day.  Pregabalin Capsules What is this medication? PREGABALIN (pre GAB a lin) treats nerve pain. It may also be used to prevent and control seizures in people with epilepsy. It works by calming overactive nerves in your body. This medicine may be used for other purposes; ask your health care provider or pharmacist if you have questions. COMMON BRAND NAME(S): Lyrica What should I tell my care team before I take this medication? They need to know if you have any of these conditions: Drug abuse or addiction Heart failure Kidney disease Lung disease Suicidal thoughts, plans or attempt An unusual or allergic reaction to pregabalin, other medications, foods, dyes, or preservatives Pregnant or trying to get pregnant Breast-feeding How should I use this medication? Take this medication by mouth with water. Take it as directed on the prescription label at the same time every day. You can take it with or without food. If it upsets your stomach, take it with food. Keep taking it unless your care team tells you to stop. A special MedGuide will be given to you by the pharmacist with each prescription and refill. Be sure to read this information carefully each time. Talk to your care team about the use of this medication in children. While it may be prescribed for children as young as 1 month for selected conditions, precautions do apply. Overdosage: If you think you have taken too much of this medicine contact a poison control center or emergency room at once. NOTE: This medicine  is only for you. Do not share this medicine with others. What if I miss a dose? If you miss a dose, take it as soon as you can. If it is almost time for your next dose, take only that dose. Do not take double or extra doses. What may interact with this medication? Alcohol Antihistamines for allergy, cough, and cold Certain medications for anxiety or sleep Certain medications for blood pressure, heart disease Certain medications for depression like amitriptyline, fluoxetine, sertraline Certain medications for diabetes, like pioglitazone, rosiglitazone Certain medications for seizures like phenobarbital, primidone General anesthetics like halothane, isoflurane, methoxyflurane, propofol Medications that relax muscles for surgery Narcotic medications for pain Phenothiazines like chlorpromazine, mesoridazine, prochlorperazine, thioridazine This list may not describe all possible interactions. Give your health care provider a list of all the medicines, herbs, non-prescription drugs, or dietary supplements you use. Also tell them if you smoke, drink alcohol, or use illegal drugs. Some items may interact with your medicine. What should I watch for while using this medication? Visit your care team for regular checks on your progress. Tell your care team if your symptoms do not start to get better or if they get worse. Do not suddenly stop taking this medication. You may develop a severe reaction. Your care team will tell you how much medication to take. If your care team wants you to stop the medication, the dose may be slowly lowered over time to avoid any side effects. You may get drowsy or dizzy. Do not drive, use machinery,  or do anything that needs mental alertness until you know how this medication affects you. Do not stand up or sit up quickly, especially if you are an older patient. This reduces the risk of dizzy or fainting spells. Alcohol may interfere with the effect of this medication. Avoid  alcoholic drinks. If you or your family notice any changes in your behavior, such as new or worsening depression, thoughts of harming yourself, anxiety, other unusual or disturbing thoughts, or memory loss, call your care team right away. Wear a medical ID bracelet or chain if you are taking this medication for seizures. Carry a card that describes your condition. List the medications and doses you take on the card. This medication may make it more difficult to father a child. Talk to your care team if you are concerned about your fertility. What side effects may I notice from receiving this medication? Side effects that you should report to your care team as soon as possible: Allergic reactions or angioedema--skin rash, itching, hives, swelling of the face, eyes, lips, tongue, arms, or legs, trouble swallowing or breathing Blurry vision Thoughts of suicide or self-harm, worsening mood, feelings of depression Trouble breathing Side effects that usually do not require medical attention (report to your care team if they continue or are bothersome): Dizziness Drowsiness Dry mouth Nausea Swelling of the ankles, feet, hands Vomiting Weight gain This list may not describe all possible side effects. Call your doctor for medical advice about side effects. You may report side effects to FDA at 1-800-FDA-1088. Where should I keep my medication? Keep out of the reach of children and pets. This medication can be abused. Keep it in a safe place to protect it from theft. Do not share it with anyone. It is only for you. Selling or giving away this medication is dangerous and against the law. Store at [employer] [address] (77 degrees F). Get rid of any unused medication after the expiration date. This medication may cause harm and death if it is taken by other adults, children, or pets. It is important to get rid of the medication as soon as you no longer need it, or it is expired. You can do this in two ways: Take  the medication to a medication take-back program. Check with your pharmacy or law enforcement to find a location. If you cannot return the medication, check the label or package insert to see if the medication should be thrown out in the garbage or flushed down the toilet. If you are not sure, ask your care team. If it is safe to put it in the trash, take the medication out of the container. Mix the medication with cat litter, dirt, coffee grounds, or other unwanted substance. Seal the mixture in a bag or container. Put it in the trash. NOTE: This sheet is a summary. It may not cover all possible information. If you have questions about this medicine, talk to your doctor, pharmacist, or health care provider.  2022 Elsevier/Gold Standard (2020-10-28 00:00:00)

## 2021-12-16 DIAGNOSIS — G501 Atypical facial pain: Secondary | ICD-10-CM | POA: Insufficient documentation

## 2021-12-16 DIAGNOSIS — M26623 Arthralgia of bilateral temporomandibular joint: Secondary | ICD-10-CM | POA: Insufficient documentation

## 2021-12-19 NOTE — Therapy (Signed)
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)   Migraine syndrome    Scoliosis 2011   TMJ (temporomandibular joint disorder)    UTI (urinary tract infection)    Vitamin D deficiency    Past Surgical History:  Procedure Laterality Date   BACK SURGERY     scoliosis   CARDIAC CATHETERIZATION     x3   [place]   for truncus    Patient Active Problem List   Diagnosis Date Noted   Bilateral temporomandibular joint pain 12/16/2021   Atypical facial pain 12/16/2021   Migraine with aura and without status migrainosus, not intractable 03/17/2019   Facial pain 03/17/2019   Ovarian cystic mass 11/16/2011      REFERRING PROVIDER: Melvenia Beam, MD   REFERRING DIAG: [phone] (ICD-10-CM) - Bilateral temporomandibular joint pain G50.1 (ICD-10-CM) - Atypical 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  Jaw pain has been present since 2019.   Symptoms include nerve pain around head/face and pain in both jaws.  Pain in TMJ is with opening and with chewing.  Treatment to date has included bite guards, braces (current), some PT.  Nothing has helped so far.    PERTINENT HISTORY:  migraines, trigeminal neuropathy, OA bil TMJs, multiple heart surgeries, spinal fusion for  scoliosis   PAIN:  Are you having pain? Yes NPRS scale: 4-6/10 Pain location: bil TMJ, feels like I wear a headband all the time with pressure across forehead Pain orientation: Bilateral  PAIN TYPE: sharp and stinging (jaw with chewing), tingling, stinging, and pressure around face  Pain description: constant but changed in intensity depending on time of day (end of day) and increases with mouth opening and chewing Aggravating factors: chewing Relieving factors: avoiding using jaw, gabapentin, cold and drinking cold drinks  PRECAUTIONS: Back (history of scoliosis surgery with hardware)  WEIGHT BEARING RESTRICTIONS No  FALLS:  Has patient fallen in last 6 months? No   LIVING ENVIRONMENT: Lives with: lives alone Lives in: House/apartment Has following equipment at home: None  OCCUPATION: CNA, full time  PLOF: Independent  PATIENT GOALS   see if I can get some relief, address posture if it is contributing to pain   OBJECTIVE:   DIAGNOSTIC FINDINGS:  MRI of face and trigeminal nerves 04/06/19: A vascular structure passes superior to and possibly contacts the right trigeminal nerve, without causing distortion of the nerve    COGNITION: Overall cognitive status: Within functional limits for tasks assessed   SENSATION: Light touch: Appears intact Pt reported less sensation along jaw  POSTURE:  Head offset from midline to Lt, holds head in slight flexion and Lt rotation, loss of cervical lordosis, decreased thoracic kyphosis, slight winging Rt scapula Hx of scoliosis, surgical rods present  PALPATION: Bil TMJ, temporalis, masseter (mild), SO, upper  traps, SCM  CERVICAL AROM/PROM  A/PROM A/PROM (deg) 12/21/2021  Flexion 45  Extension 55  Right lateral flexion 30  Left lateral flexion 20  Right rotation 65  Left rotation 60   (Blank rows = not tested) Some upper trap pain with Rt/Lt rotation  JAW AROM Alignment: Pt has open bite (front teeth do not meet), Jaw offset from midline to Lt Jaw opening: 45 mm, deviation with midline return, Rt jaw clicking at max opening Rt lateral deviation: 10 mm Lt lateral deviation: 16 mm   UE AROM/PROM:   WFL  UE MMT: UEs 4/5 bil Scapular stabilizers 4/5   Cervical mobility Limited Rt C1/2  Limited posterior roll bil O/A joints Limited Lt sideglides     TODAY'S TREATMENT:  DN info discussed, handout given Access Code: [email or web address] URL: [email or web address] Date: 12/21/2021 Prepared by: Venetia Night Braelin Costlow  Exercises Lateral Jaw Glide - 2 x daily - 7 x weekly - 1 sets - 10 reps (TO THE RIGHT) Jaw Opening - 2 x daily - 7 x weekly - 1 sets - 10 reps (TONGUE TO ROOF OF MOUTH WITH MANUAL RETRACTION LIGHT PRESSURE AT CHIN)    PATIENT EDUCATION:  Education details: Access Code: [email or web address], DN info Person educated: Patient Education method: Explanation, Demonstration, and Handouts Education comprehension: verbalized understanding and returned demonstration   HOME EXERCISE PROGRAM: Access Code: [email or web address]  ASSESSMENT:  CLINICAL IMPRESSION: Patient is a 26 y.o. female who was seen today for physical therapy evaluation and treatment for bil jaw pain, facial pain/pressure.  Pt is working with orthodontist and has braces.  Pt has tried night guards without relief.  Pt has Dx of trigeminal neuralgia.  She has postural dysfunction with hypomobility in bil O/A, Rt A/A joints and Lt U-joint sideglides.  She has history of scoliosis surgery with hardware/rods present.  She holds her head in Lt Rot, jaw is deviated Lt.  She has trigger points in bil upper traps and SOs, with  tenderness present along bil  TMJ, masseter and temporalis.  Objective impairments include decreased activity tolerance, decreased ROM, decreased strength, hypomobility, increased muscle spasms, impaired sensation, improper body mechanics, postural dysfunction, and pain. These impairments are limiting patient from  opening mouth, chewing . Personal factors including Time since onset of injury/illness/exacerbation and 1 comorbidity: trigeminal neuraligia  are also affecting patient's functional outcome. Patient will benefit from skilled PT to address above impairments and improve overall function.  REHAB POTENTIAL: Good  CLINICAL DECISION MAKING: Stable/uncomplicated  EVALUATION COMPLEXITY: Low   GOALS: Goals reviewed with patient? Yes  SHORT TERM GOALS:  STG Name Target Date Goal status  1 Pt will be ind with initial HEP without exacerbation of pain Baseline:  01/11/2022 INITIAL  2  Baseline:     3  Baseline:                         LONG TERM GOALS:   LTG Name Target Date Goal status  1 Pt will be ind with advanced HEP Baseline: 02/15/2022 INITIAL  2 Pt will report at least 50% reduced pain with chewing and opening mouth. Baseline: 02/15/2022 INITIAL  3 Pt will achieve UE and scapular strength of at least 4+/5 for improved postural support with dynamic tasks and static postures. Baseline: 02/15/2022 INITIAL  4 Pt able to demo mouth opening with end range control to prevent clicking. Baseline: 02/15/2022 INITIAL  5  Baseline:    6  Baseline:    7  Baseline:     PLAN: PT FREQUENCY: 1-2x/week  PT DURATION: 8 weeks  PLANNED INTERVENTIONS: Therapeutic exercises, Therapeutic activity, Neuro Muscular re-education, Patient/Family education, Joint mobilization, Dry Needling, Electrical stimulation, Spinal mobilization, Cryotherapy, Moist heat, Taping, Ionotophoresis 4mg /ml Dexamethasone, and Manual therapy  PLAN FOR NEXT SESSION: DN bil upper traps, SO, temporalis, masseter, joint  mobs to upper c-spine, review initial HEP, add jaw isometrics, scapular strengthening and cervical stretches   Hawley Pavia, PT 12/21/21 1:41 PM

## 2021-12-21 ENCOUNTER — Ambulatory Visit: Payer: BC Managed Care – PPO | Attending: Neurology | Admitting: Physical Therapy

## 2021-12-21 ENCOUNTER — Other Ambulatory Visit: Payer: Self-pay

## 2021-12-21 ENCOUNTER — Encounter: Payer: Self-pay | Admitting: Physical Therapy

## 2021-12-21 DIAGNOSIS — M26623 Arthralgia of bilateral temporomandibular joint: Secondary | ICD-10-CM | POA: Insufficient documentation

## 2021-12-21 DIAGNOSIS — R293 Abnormal posture: Secondary | ICD-10-CM | POA: Diagnosis not present

## 2021-12-21 DIAGNOSIS — G509 Disorder of trigeminal nerve, unspecified: Secondary | ICD-10-CM | POA: Insufficient documentation

## 2021-12-21 DIAGNOSIS — G501 Atypical facial pain: Secondary | ICD-10-CM | POA: Insufficient documentation

## 2021-12-21 DIAGNOSIS — R252 Cramp and spasm: Secondary | ICD-10-CM | POA: Diagnosis not present

## 2021-12-21 NOTE — Patient Instructions (Signed)

## 2021-12-29 NOTE — Therapy (Signed)
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)   Migraine syndrome    Scoliosis 2011   TMJ (temporomandibular joint disorder)    UTI (urinary tract infection)    Vitamin D deficiency    Past Surgical History:  Procedure Laterality Date   BACK SURGERY     scoliosis   CARDIAC CATHETERIZATION     x3   [place]   for truncus    Patient Active Problem List   Diagnosis Date Noted   Bilateral temporomandibular joint pain 12/16/2021   Atypical facial pain 12/16/2021   Migraine with aura and without status migrainosus, not intractable 03/17/2019   Facial pain 03/17/2019   Ovarian cystic mass 11/16/2011    REFERRING DIAG: bil TMJ pain; facial pain; trigeminal neuropathy    THERAPY DIAG:  abnormal posture, cramp and spasm   PERTINENT HISTORY:  Migraines, trigeminal neuropathy; OA bil TMJs, multiple heart surgeries; spinal fusion for scoliosis  PRECAUTIONS:  Back scoliosis surgery with hardware  SUBJECTIVE: No questions regarding DN.  Ready to try DN.    PAIN:  Are you having pain? Yes NPRS scale: 4/10 Pain location: jaw, neck  Pain orientation: Bilateral         ONSET DATE: chronic       OBJECTIVE from initial eval on 12/21/21   DIAGNOSTIC FINDINGS:  MRI of face and trigeminal nerves 04/06/19: A vascular structure passes superior to and possibly contacts the right  trigeminal nerve, without causing distortion of the nerve      COGNITION: Overall cognitive status: Within functional limits for tasks assessed            SENSATION: Light touch: Appears intact Pt reported less sensation along jaw   POSTURE:  Head offset from midline to Lt, holds head in slight flexion and Lt rotation, loss of cervical lordosis, decreased thoracic kyphosis, slight winging Rt scapula Hx of scoliosis, surgical rods present   PALPATION: Bil TMJ, temporalis, masseter (mild), SO, upper traps, SCM   CERVICAL AROM/PROM   A/PROM A/PROM (deg) 12/21/2021  Flexion 45  Extension 55  Right lateral flexion 30  Left lateral flexion 20  Right rotation 65  Left rotation 60   (Blank rows = not tested) Some upper trap pain with Rt/Lt rotation   JAW AROM Alignment: Pt has open bite (front teeth do not meet), Jaw offset from midline to Lt Jaw opening: 45 mm, deviation with midline return, Rt jaw clicking at max opening Rt lateral deviation: 10 mm Lt lateral deviation: 16 mm     UE AROM/PROM:            WFL   UE MMT: UEs 4/5 bil Scapular stabilizers 4/5  Cervical mobility Limited Rt C1/2  Limited posterior roll bil O/A joints Limited Lt sideglides                   Today's treatment for 12/30/2021:   Trigger Point Dry-Needling  Treatment instructions: Expect mild to moderate muscle soreness. S/S of pneumothorax if dry needled over a lung field, and to seek immediate medical attention should they occur. Patient verbalized understanding of these instructions and education.  Patient Consent Given: Yes Education handout provided: Yes Muscles treated: bil masseters, temporalis, bil cervical multifidi, bil upper traps, bil suboccipitals Electrical stimulation performed: No Parameters: N/A Treatment response/outcome: Twitch response elicited and Palpable decrease in muscle tension  Instructed in additional HEP to compliment today's treatment: chin tucks, scap  retractions, upper trap stretch     TREATMENT for initial eval:  DN info discussed, handout given Access Code: [email or web address] URL: [email or web address] Date: 12/21/2021 Prepared by: Venetia Night Beuhring   Exercises Lateral Jaw Glide - 2 x daily - 7 x weekly - 1 sets - 10 reps (TO THE RIGHT) Jaw Opening - 2 x daily - 7 x weekly - 1 sets - 10 reps (TONGUE TO ROOF OF MOUTH WITH MANUAL RETRACTION LIGHT PRESSURE AT CHIN)    Access Code: [email or web address] URL: [email or web address] Date: 12/30/2021 Prepared by: Ruben Im  Exercises Lateral Jaw Glide - 2 x daily - 7 x weekly - 1 sets - 10 reps Jaw Opening - 2 x daily - 7 x weekly - 1 sets - 10 reps Seated Upper Trapezius Stretch - 1 x daily - 7 x weekly - 1 sets - 3 reps - 20 hold Seated Cervical Retraction - 1 x daily - 7 x weekly - 1 sets - 10 reps Seated Scapular Retraction - 1 x daily - 7 x weekly - 1 sets - 10 reps    PATIENT EDUCATION:  Education details: Access Code: [email or web address], DN info Person educated: Patient Education method: Explanation, Demonstration, and Handouts Education comprehension: verbalized understanding and returned demonstration     HOME EXERCISE PROGRAM: Access Code: [email or web address]   ASSESSMENT:   CLINICAL IMPRESSION:  The patient returns following initial assessment.  She is receptive to trying DN in conjunction with manual therapy since previous traditional PT did not improve symptoms.  Discussed that ex's may be more effective following DN treatment.  Tender points identified in masseters, lateral pterygoid, temporalis, upper traps and suboccipitals.  Much improved soft tissue mobility following treatment.  Therapist monitoring response throughout treatment session.     REHAB POTENTIAL: Good   CLINICAL DECISION MAKING: Stable/uncomplicated   EVALUATION COMPLEXITY: Low     GOALS: Goals reviewed with patient? Yes   SHORT TERM GOALS:   STG Name Target Date Goal status  1 Pt will be ind with  initial HEP without exacerbation of pain Baseline:  01/11/2022 INITIAL  2   Baseline:       3   Baseline:                                            LONG TERM GOALS:    LTG Name Target Date Goal status  1 Pt will be ind with advanced HEP Baseline: 02/15/2022 INITIAL  2 Pt will report at least 50% reduced pain with chewing and opening mouth. Baseline: 02/15/2022 INITIAL  3 Pt will achieve UE and scapular strength of at least 4+/5 for improved  postural support with dynamic tasks and static postures. Baseline: 02/15/2022 INITIAL  4 Pt able to demo mouth opening with end range control to prevent clicking. Baseline: 02/15/2022 INITIAL  5   Baseline:      6   Baseline:      7   Baseline:        PLAN: PT FREQUENCY: 1-2x/week   PT DURATION: 8 weeks   PLANNED INTERVENTIONS: Therapeutic exercises, Therapeutic activity, Neuro Muscular re-education, Patient/Family education, Joint mobilization, Dry Needling, Electrical stimulation, Spinal mobilization, Cryotherapy, Moist heat, Taping, Ionotophoresis 4mg /ml Dexamethasone, and Manual therapy   PLAN FOR NEXT SESSION: assess response to DN #1 bil upper traps, SO, temporalis, masseter, joint mobs to upper c-spine, progress HEP, add additional jaw isometrics, scapular strengthening and cervical stretches    Ruben Im, PT 12/30/21 3:14 PM Phone: [phone] Fax: [phone]

## 2021-12-30 ENCOUNTER — Other Ambulatory Visit: Payer: Self-pay

## 2021-12-30 ENCOUNTER — Ambulatory Visit: Payer: BC Managed Care – PPO | Admitting: Physical Therapy

## 2021-12-30 DIAGNOSIS — R293 Abnormal posture: Secondary | ICD-10-CM

## 2021-12-30 DIAGNOSIS — M26623 Arthralgia of bilateral temporomandibular joint: Secondary | ICD-10-CM | POA: Diagnosis not present

## 2021-12-30 DIAGNOSIS — R252 Cramp and spasm: Secondary | ICD-10-CM

## 2021-12-30 NOTE — Patient Instructions (Signed)

## 2022-01-03 ENCOUNTER — Ambulatory Visit: Payer: BC Managed Care – PPO

## 2022-01-03 ENCOUNTER — Other Ambulatory Visit: Payer: Self-pay

## 2022-01-03 DIAGNOSIS — M26623 Arthralgia of bilateral temporomandibular joint: Secondary | ICD-10-CM | POA: Diagnosis not present

## 2022-01-03 DIAGNOSIS — R252 Cramp and spasm: Secondary | ICD-10-CM

## 2022-01-03 DIAGNOSIS — R293 Abnormal posture: Secondary | ICD-10-CM

## 2022-01-03 NOTE — Therapy (Signed)
OUTPATIENT PHYSICAL THERAPY TREATMENT NOTE   Patient Name: Angela Marsh MRN: [id number] DOB:05-24-1996, 26 y.o., female Today's Date: 12/30/2021  PCP: Glenis Smoker, MD REFERRING PROVIDER: Melvenia Beam, MD   PT End of Session - 01/03/22 1609     Visit Number 3    Date for PT Re-Evaluation 02/15/22    Authorization Type BCBS    PT Start Time 70    PT Stop Time 6789    PT Time Calculation (min) 35 min    Activity Tolerance Patient tolerated treatment well    Behavior During Therapy [facility] for tasks assessed/performed              Past Medical History:  Diagnosis Date   Acne    Anxiety    Eczema    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)   Migraine syndrome    Scoliosis 2011   TMJ (temporomandibular joint disorder)    UTI (urinary tract infection)    Vitamin D deficiency    Past Surgical History:  Procedure Laterality Date   BACK SURGERY     scoliosis   CARDIAC CATHETERIZATION     x3   [place]   for truncus    Patient Active Problem List   Diagnosis Date Noted   Bilateral temporomandibular joint pain 12/16/2021   Atypical facial pain 12/16/2021   Migraine with aura and without status migrainosus, not intractable 03/17/2019   Facial pain 03/17/2019   Ovarian cystic mass 11/16/2011    REFERRING DIAG: bil TMJ pain; facial pain; trigeminal neuropathy    THERAPY DIAG:  abnormal posture, cramp and spasm   PERTINENT HISTORY:  Migraines, trigeminal neuropathy; OA bil TMJs, multiple heart surgeries; spinal fusion for scoliosis  PRECAUTIONS:  Back scoliosis surgery with hardware  SUBJECTIVE: I have some soreness after needling.  No change in symptoms yet.    PAIN:  Are you having pain? Yes NPRS scale: 4/10 Pain location: jaw, neck  Pain orientation: Bilateral  Aggravating factors: talking, caffeine, stress Alleviating factors: ice, medication, relaxation   ONSET DATE: chronic    OBJECTIVE from  initial eval on 12/21/21   DIAGNOSTIC FINDINGS:  MRI of face and trigeminal nerves 04/06/19: A vascular structure passes superior to and possibly contacts the right trigeminal nerve, without causing distortion of the nerve      COGNITION: Overall cognitive status: Within functional limits for tasks assessed            SENSATION: Light touch: Appears intact Pt reported less sensation along jaw   POSTURE:  Head offset from midline to Lt, holds head in slight flexion and Lt rotation, loss of cervical lordosis, decreased thoracic kyphosis, slight winging Rt scapula Hx of scoliosis, surgical rods present   PALPATION: Bil TMJ, temporalis, masseter (mild), SO, upper traps, SCM   CERVICAL AROM/PROM   A/PROM A/PROM (deg) 12/21/2021  Flexion 45  Extension 55  Right lateral flexion 30  Left lateral flexion 20  Right rotation 65  Left rotation 60   (Blank rows = not tested) Some upper trap pain with Rt/Lt rotation   JAW AROM Alignment: Pt has open bite (front teeth do not meet), Jaw offset from midline to Lt Jaw opening: 45 mm, deviation with midline return, Rt jaw clicking at max opening Rt lateral deviation: 10 mm Lt lateral deviation: 16 mm     UE AROM/PROM:  WFL   UE MMT: UEs 4/5 bil Scapular stabilizers 4/5     Cervical mobility Limited Rt C1/2  Limited posterior roll bil O/A joints Limited Lt sideglides                  TODAY'S TREATMENT:   01/03/22 Trigger Point Dry-Needling  Treatment instructions: Expect mild to moderate muscle soreness. S/S of pneumothorax if dry needled over a lung field, and to seek immediate medical attention should they occur. Patient verbalized understanding of these instructions and education.  Patient Consent Given: Yes Education handout provided: Yes Muscles treated: bil masseters, temporalis, bil cervical multifidi, bil upper traps, bil suboccipitals Electrical stimulation performed: No Parameters: N/A Treatment  response/outcome: Twitch response elicited and Palpable decrease in muscle tension   Manual therapy:  skilled palpation and monitoring with DN today.  Elongation and release to Rt>Lt upper traps and neck.     12/30/2021:    Trigger Point Dry-Needling  Treatment instructions: Expect mild to moderate muscle soreness. S/S of pneumothorax if dry needled over a lung field, and to seek immediate medical attention should they occur. Patient verbalized understanding of these instructions and education.  Patient Consent Given: Yes Education handout provided: Yes Muscles treated: bil masseters, temporalis, bil cervical multifidi, bil upper traps, bil suboccipitals Electrical stimulation performed: No Parameters: N/A Treatment response/outcome: Twitch response elicited and Palpable decrease in muscle tension  Instructed in additional HEP to compliment today's treatment: chin tucks, scap retractions, upper trap stretch     TREATMENT for initial eval:  DN info discussed, handout given Access Code: [email or web address] URL: [email or web address] Date: 12/21/2021 Prepared by: Venetia Night Beuhring   Exercises Lateral Jaw Glide - 2 x daily - 7 x weekly - 1 sets - 10 reps (TO THE RIGHT) Jaw Opening - 2 x daily - 7 x weekly - 1 sets - 10 reps (TONGUE TO ROOF OF MOUTH WITH MANUAL RETRACTION LIGHT PRESSURE AT CHIN)    Access Code: [email or web address] URL: [email or web address] Date: 12/30/2021 Prepared by: Ruben Im  Exercises Lateral Jaw Glide - 2 x daily - 7 x weekly - 1 sets - 10 reps Jaw Opening - 2 x daily - 7 x weekly - 1 sets - 10 reps Seated Upper Trapezius Stretch - 1 x daily - 7 x weekly - 1 sets - 3 reps - 20 hold Seated Cervical Retraction - 1 x daily - 7 x weekly - 1 sets - 10 reps Seated Scapular Retraction - 1 x daily - 7 x weekly - 1 sets - 10 reps    PATIENT EDUCATION:  Education details: Access Code: [email or web address], DN info Person educated: Patient Education method: Explanation,  Demonstration, and Handouts Education comprehension: verbalized understanding and returned demonstration     HOME EXERCISE PROGRAM: Access Code: [email or web address]   ASSESSMENT:   CLINICAL IMPRESSION:  Pt denies any changes in symptoms since the start of care.  Pt is independent and complaint in HEP for neck and jaw.  Pt will stretch after treatment today to improve benefit of DN today.  Pt with trigger points and good response to DN today with twitch and improved tissue mobility.  Pt with Rt>Lt cervical tension overall. Pt also has trigger points in rhomboids and subscapular musculature and may benefit from DN to address.  Pt will continue to benefit from skilled PT to address neck and jaw pain.     REHAB POTENTIAL: Good   CLINICAL DECISION MAKING: Stable/uncomplicated   EVALUATION COMPLEXITY: Low     GOALS: Goals reviewed  with patient? Yes   SHORT TERM GOALS:   STG Name Target Date Goal status  1 Pt will be ind with initial HEP without exacerbation of pain Baseline:  01/11/2022 INITIAL  2   Baseline:       3   Baseline:                                            LONG TERM GOALS:    LTG Name Target Date Goal status  1 Pt will be ind with advanced HEP Baseline: 02/15/2022 INITIAL  2 Pt will report at least 50% reduced pain with chewing and opening mouth. Baseline: 02/15/2022 INITIAL  3 Pt will achieve UE and scapular strength of at least 4+/5 for improved postural support with dynamic tasks and static postures. Baseline: 02/15/2022 INITIAL  4 Pt able to demo mouth opening with end range control to prevent clicking. Baseline: 02/15/2022 INITIAL  5   Baseline:      6   Baseline:      7   Baseline:        PLAN: PT FREQUENCY: 1-2x/week   PT DURATION: 8 weeks   PLANNED INTERVENTIONS: Therapeutic exercises, Therapeutic activity, Neuro Muscular re-education, Patient/Family education, Joint mobilization, Dry Needling, Electrical stimulation, Spinal mobilization, Cryotherapy, Moist  heat, Taping, Ionotophoresis 4mg /ml Dexamethasone, and Manual therapy   PLAN FOR NEXT SESSION: assess response to DN #2 bil upper traps, SO, temporalis, masseter, joint mobs to upper c-spine, progress HEP, add additional jaw isometrics, scapular strengthening and cervical stretches    Sigurd Sos, PT 01/03/22 4:15 PM   [facility] [address] [place], [place] [address] Phone # [phone] Fax [phone]

## 2022-01-06 ENCOUNTER — Encounter: Payer: Self-pay | Admitting: Physical Therapy

## 2022-01-12 ENCOUNTER — Ambulatory Visit: Payer: BC Managed Care – PPO | Attending: Neurology

## 2022-01-12 ENCOUNTER — Other Ambulatory Visit: Payer: Self-pay

## 2022-01-12 DIAGNOSIS — R293 Abnormal posture: Secondary | ICD-10-CM | POA: Insufficient documentation

## 2022-01-12 DIAGNOSIS — R252 Cramp and spasm: Secondary | ICD-10-CM | POA: Insufficient documentation

## 2022-01-12 NOTE — Therapy (Addendum)
OUTPATIENT PHYSICAL THERAPY TREATMENT NOTE   Patient Name: Angela Marsh MRN: [id number] DOB:May 19, 1996, 26 y.o., female Today's Date: 12/30/2021  PCP: Glenis Smoker, MD REFERRING PROVIDER: Melvenia Beam, MD   PT End of Session - 01/12/22 1612     Visit Number 4    Date for PT Re-Evaluation 02/15/22    Authorization Type BCBS    PT Start Time 6073    PT Stop Time 1608    PT Time Calculation (min) 33 min    Activity Tolerance Patient tolerated treatment well    Behavior During Therapy 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)   Migraine syndrome    Scoliosis 2011   TMJ (temporomandibular joint disorder)    UTI (urinary tract infection)    Vitamin D deficiency    Past Surgical History:  Procedure Laterality Date   BACK SURGERY     scoliosis   CARDIAC CATHETERIZATION     x3   [place]   for truncus    Patient Active Problem List   Diagnosis Date Noted   Bilateral temporomandibular joint pain 12/16/2021   Atypical facial pain 12/16/2021   Migraine with aura and without status migrainosus, not intractable 03/17/2019   Facial pain 03/17/2019   Ovarian cystic mass 11/16/2011    REFERRING DIAG: bil TMJ pain; facial pain; trigeminal neuropathy    THERAPY DIAG:  abnormal posture, cramp and spasm   PERTINENT HISTORY:  Migraines, trigeminal neuropathy; OA bil TMJs, multiple heart surgeries; spinal fusion for scoliosis  PRECAUTIONS:  Back scoliosis surgery with hardware  SUBJECTIVE: I just went to the orthodontist today and I am tight.    PAIN:  Are you having pain? Yes NPRS scale: 5/10 Pain location: jaw, neck  Pain orientation: Bilateral  Aggravating factors: talking, caffeine, stress Alleviating factors: ice, medication, relaxation   ONSET DATE: chronic    OBJECTIVE from initial  eval on 12/21/21   DIAGNOSTIC FINDINGS:  MRI of face and trigeminal nerves 04/06/19: A vascular structure passes superior to and possibly contacts the right trigeminal nerve, without causing distortion of the nerve      COGNITION: Overall cognitive status: Within functional limits for tasks assessed            SENSATION: Light touch: Appears intact Pt reported less sensation along jaw   POSTURE:  Head offset from midline to Lt, holds head in slight flexion and Lt rotation, loss of cervical lordosis, decreased thoracic kyphosis, slight winging Rt scapula Hx of scoliosis, surgical rods present   PALPATION: Bil TMJ, temporalis, masseter (mild), SO, upper traps, SCM   CERVICAL AROM/PROM   A/PROM A/PROM (deg) 12/21/2021  Flexion 45  Extension 55  Right lateral flexion 30  Left lateral flexion 20  Right rotation 65  Left rotation 60   (Blank rows = not tested) Some upper trap pain with Rt/Lt rotation   JAW AROM Alignment: Pt has open bite (front teeth do not meet), Jaw offset from midline to Lt Jaw opening: 45 mm, deviation with midline return, Rt jaw clicking at max opening Rt lateral deviation: 10 mm Lt lateral deviation: 16 mm     UE AROM/PROM:  WFL   UE MMT: UEs 4/5 bil Scapular stabilizers 4/5     Cervical mobility Limited Rt C1/2  Limited posterior roll bil O/A joints Limited Lt sideglides                  TODAY'S TREATMENT:  01/12/22 Trigger Point Dry-Needling  Treatment instructions: Expect mild to moderate muscle soreness. S/S of pneumothorax if dry needled over a lung field, and to seek immediate medical attention should they occur. Patient verbalized understanding of these instructions and education.  Patient Consent Given: Yes Education handout provided: Yes Muscles treated: bil masseters, temporalis, bil cervical multifidi, bil upper traps, bil suboccipitals, rhomboids and subscapularis Electrical stimulation performed: No Parameters:  N/A Treatment response/outcome: Twitch response elicited and Palpable decrease in muscle tension   Manual therapy:  skilled palpation and monitoring with DN today.  Elongation and release to Rt>Lt upper traps and neck.   Discussed importance of postural corrections and alignment.  Discussed beginner barre classes or pilates to improve functional strength and alignment.     01/03/22 Trigger Point Dry-Needling  Treatment instructions: Expect mild to moderate muscle soreness. S/S of pneumothorax if dry needled over a lung field, and to seek immediate medical attention should they occur. Patient verbalized understanding of these instructions and education.  Patient Consent Given: Yes Education handout provided: Yes Muscles treated: bil masseters, temporalis, bil cervical multifidi, bil upper traps, bil suboccipitals Electrical stimulation performed: No Parameters: N/A Treatment response/outcome: Twitch response elicited and Palpable decrease in muscle tension   Manual therapy:  skilled palpation and monitoring with DN today.  Elongation and release to Rt>Lt upper traps and neck.     12/30/2021:    Trigger Point Dry-Needling  Treatment instructions: Expect mild to moderate muscle soreness. S/S of pneumothorax if dry needled over a lung field, and to seek immediate medical attention should they occur. Patient verbalized understanding of these instructions and education.  Patient Consent Given: Yes Education handout provided: Yes Muscles treated: bil masseters, temporalis, bil cervical multifidi, bil upper traps, bil suboccipitals Electrical stimulation performed: No Parameters: N/A Treatment response/outcome: Twitch response elicited and Palpable decrease in muscle tension  Instructed in additional HEP to compliment today's treatment: chin tucks, scap retractions, upper trap stretch     TREATMENT for initial eval:  DN info discussed, handout given Access Code: [email or web address] URL:  [email or web address] Date: 12/21/2021 Prepared by: Venetia Night Beuhring   Exercises Lateral Jaw Glide - 2 x daily - 7 x weekly - 1 sets - 10 reps (TO THE RIGHT) Jaw Opening - 2 x daily - 7 x weekly - 1 sets - 10 reps (TONGUE TO ROOF OF MOUTH WITH MANUAL RETRACTION LIGHT PRESSURE AT CHIN)    Access Code: [email or web address] URL: [email or web address] Date: 12/30/2021 Prepared by: Ruben Im  Exercises Lateral Jaw Glide - 2 x daily - 7 x weekly - 1 sets - 10 reps Jaw Opening - 2 x daily - 7 x weekly - 1 sets - 10 reps Seated Upper Trapezius Stretch - 1 x daily - 7 x weekly - 1 sets - 3 reps - 20 hold Seated Cervical Retraction - 1 x daily - 7 x weekly - 1 sets - 10 reps Seated Scapular Retraction - 1 x daily - 7 x weekly - 1 sets - 10 reps    PATIENT EDUCATION:  Education details: Access Code: [email or web address], DN info Person educated: Patient Education method: Explanation, Demonstration, and Handouts Education comprehension: verbalized understanding and returned demonstration     HOME EXERCISE  PROGRAM: Access Code: [email or web address]   ASSESSMENT:   CLINICAL IMPRESSION:  Pt denies any changes in jaw symptoms since the start of care.  Pt is independent and complaint in HEP for neck and jaw.  Pt also has braces to improve occulsion and alignment.  Pt had braces adjusted today so requested less needling in the masseters today.  Pt with trigger points and good response to DN today with twitch and improved tissue mobility.  Pt with Rt>Lt cervical tension overall. Pt with tension and trigger points in rhomboids and subscapularis bilaterally and had good response to DN in this region today.  Pt with poor postural endurance and PT emphasized importance of postural corrections and initiation of a strength program such as pilates or barre at the beginner level.  Pt will continue to benefit from skilled PT to address neck and jaw pain.     REHAB POTENTIAL: Good   CLINICAL DECISION MAKING:  Stable/uncomplicated   EVALUATION COMPLEXITY: Low     GOALS: Goals reviewed with patient? Yes   SHORT TERM GOALS:   STG Name Target Date Goal status  1 Pt will be ind with initial HEP without exacerbation of pain Baseline:  01/11/2022 INITIAL  2   Baseline:       3   Baseline:                                            LONG TERM GOALS:    LTG Name Target Date Goal status  1 Pt will be ind with advanced HEP Baseline: 02/15/2022 INITIAL  2 Pt will report at least 50% reduced pain with chewing and opening mouth. Baseline: 02/15/2022 INITIAL  3 Pt will achieve UE and scapular strength of at least 4+/5 for improved postural support with dynamic tasks and static postures. Baseline: 02/15/2022 INITIAL  4 Pt able to demo mouth opening with end range control to prevent clicking. Baseline: 02/15/2022 INITIAL  5   Baseline:      6   Baseline:      7   Baseline:        PLAN: PT FREQUENCY: 1-2x/week   PT DURATION: 8 weeks   PLANNED INTERVENTIONS: Therapeutic exercises, Therapeutic activity, Neuro Muscular re-education, Patient/Family education, Joint mobilization, Dry Needling, Electrical stimulation, Spinal mobilization, Cryotherapy, Moist heat, Taping, Ionotophoresis '4mg'$ /ml Dexamethasone, and Manual therapy   PLAN FOR NEXT SESSION: assess response to DN #2 .  Progress HEP to include jaw strength and postural strength with bands.    Sigurd Sos, PT 01/12/22 4:13 PM   [facility] [address] [place], [place] [address] Phone # [phone] Fax [phone]

## 2022-01-27 ENCOUNTER — Other Ambulatory Visit: Payer: Self-pay

## 2022-01-27 ENCOUNTER — Ambulatory Visit: Payer: BC Managed Care – PPO

## 2022-01-27 DIAGNOSIS — R293 Abnormal posture: Secondary | ICD-10-CM | POA: Diagnosis not present

## 2022-01-27 DIAGNOSIS — R252 Cramp and spasm: Secondary | ICD-10-CM

## 2022-01-27 NOTE — Therapy (Signed)
?OUTPATIENT PHYSICAL THERAPY TREATMENT NOTE ? ? ?Patient Name: Angela Marsh ?MRN: [id number] ?DOB:[birth year], 26 y.o., female ?Today's Date: 12/30/2021 ? ?PCP: Glenis Smoker, MD ?REFERRING PROVIDER: Melvenia Beam, MD ? ? PT End of Session - 01/27/22 1740   ? ? Visit Number [id number]   ? Date for PT Re-Evaluation 02/15/22   ? Authorization Type [employer]   ? PT Start Time 8144   ? PT Stop Time 1006   ? PT Time Calculation (min) 34 min   ? Activity Tolerance Patient tolerated treatment well   ? Behavior During Therapy [facility] for tasks assessed/performed   ? ?  ?  ? ?  ? ? ? ? ? ?Past Medical History:  ?Diagnosis Date  ? Acne   ? Anxiety   ? Eczema   ? 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 Migraine syndrome   ? Scoliosis 2011  ? TMJ (temporomandibular joint disorder)   ? UTI (urinary tract infection)   ? Vitamin D deficiency   ? ?Past Surgical History:  ?Procedure Laterality Date  ? BACK SURGERY    ? scoliosis  ? CARDIAC CATHETERIZATION    ? x3  ? CARDIAC SURGERY  1998  ? for truncus   ? ?Patient Active Problem List  ? Diagnosis Date Noted  ? Bilateral temporomandibular joint pain 12/16/2021  ? Atypical facial pain 12/16/2021  ? Migraine with aura and without status migrainosus, not intractable 03/17/2019  ? Facial pain 03/17/2019  ? Ovarian cystic mass 11/16/2011  ? ? ?REFERRING DIAG: bil TMJ pain; facial pain; trigeminal neuropathy  ? ? ?THERAPY DIAG:  abnormal posture, cramp and spasm ? ? ?PERTINENT HISTORY:  ?Migraines, trigeminal neuropathy; OA bil TMJs, multiple heart surgeries; spinal fusion for scoliosis ? ?PRECAUTIONS:  ?Back scoliosis surgery with hardware ? ?SUBJECTIVE: I don't see much change.  I am sore after DN and I don't think it is helping,  ? ?PAIN:  ?Are you having pain? Yes ?NPRS scale: 3-4/10 ?Pain location: jaw, neck  ?Pain orientation: Bilateral  ?Aggravating factors: talking, caffeine, stress ?Alleviating factors: ice, medication, relaxation ? ? ?ONSET DATE: chronic ?    ?OBJECTIVE from initial eval on 12/21/21 ?  ?DIAGNOSTIC FINDINGS:  ?MRI of face and trigeminal nerves 04/06/19: A vascular structure passes superior to and possibly contacts the right trigeminal nerve, without causing distortion of the nerve  ?  ?  ?COGNITION: ?Overall cognitive status: Within functional limits for tasks assessed ?           ?SENSATION: ?Light touch: Appears intact ?Pt reported less sensation along jaw ?  ?POSTURE:  ?Head offset from midline to Lt, holds head in slight flexion and Lt rotation, loss of cervical lordosis, decreased thoracic kyphosis, slight winging Rt scapula ?Hx of scoliosis, surgical rods present ?  ?PALPATION: ?Bil TMJ, temporalis, masseter (mild), SO, upper traps, SCM ?  ?CERVICAL AROM/PROM ?  ?A/PROM A/PROM (deg) ?12/21/2021 A/ROM  ?01/27/22  ?Flexion 45   ?Extension 55   ?Right lateral flexion 30   ?Left lateral flexion 20   ?Right rotation 65   ?Left rotation 60   ? (Blank rows = not tested) ?Some upper trap pain with Rt/Lt rotation ?  ?JAW AROM ?Alignment: Pt has open bite (front teeth do not meet), Jaw offset from midline to Lt ?Jaw opening: 45 mm, deviation with midline return, Rt jaw clicking at max opening ?Rt lateral deviation: 10 mm ?Lt  lateral deviation: 16 mm ?  ?  ?UE AROM/PROM: ?           WFL ?  ?UE MMT: ?UEs 4/5 bil ?Scapular stabilizers 4/5 ?  ?  ?Cervical mobility ?Limited Rt C1/2  ?Limited posterior roll bil O/A joints ?Limited Lt sideglides ?  ?  ?             TODAY'S TREATMENT:  ?Treatment on date: 01/27/22 ?Seated upper trap stretch 3x20 seconds bil.-verbal and tactile cues for reduced substitution. ?Seated chin and scapular retraction x10 each ?Supine theraband: red D2, horizontal abduction, and ER 2x10 ?Education regarding meditation and Cur[employer] app for pain-information provided ?  ?01/12/22 ?Trigger Point Dry-Needling  ?Treatment instructions: Expect mild to moderate muscle soreness. S/S of pneumothorax if dry needled over a lung field, and to seek immediate  medical attention should they occur. Patient verbalized understanding of these instructions and education. ? ?Patient Consent Given: Yes ?Education handout provided: Yes ?Muscles treated: bil masseters, temporalis, bil cervical multifidi, bil upper traps, bil suboccipitals, rhomboids and subscapularis ?Electrical stimulation performed: No ?Parameters: N/A ?Treatment response/outcome: Twitch response elicited and Palpable decrease in muscle tension  ? Manual therapy:  skilled palpation and monitoring with DN today.  Elongation and release to Rt>Lt upper traps and neck.  ? ?Discussed importance of postural corrections and alignment.  Discussed beginner barre classes or pilates to improve functional strength and alignment.  ?  ? 01/03/22 ?Trigger Point Dry-Needling  ?Treatment instructions: Expect mild to moderate muscle soreness. S/S of pneumothorax if dry needled over a lung field, and to seek immediate medical attention should they occur. Patient verbalized understanding of these instructions and education. ? ?Patient Consent Given: Yes ?Education handout provided: Yes ?Muscles treated: bil masseters, temporalis, bil cervical multifidi, bil upper traps, bil suboccipitals ?Electrical stimulation performed: No ?Parameters: N/A ?Treatment response/outcome: Twitch response elicited and Palpable decrease in muscle tension  ? Manual therapy:  skilled palpation and monitoring with DN today.  Elongation and release to Rt>Lt upper traps and neck.  ?  ?  ?  ?Access Code: [email or web address] ?URL: [email or web address] ?Date: 01/27/2022 ?Prepared by: Claiborne Billings ? ?Exercises ?- Lateral Jaw Glide  - 2 x daily - 7 x weekly - 1 sets - 10 reps ?- Jaw Opening  - 2 x daily - 7 x weekly - 1 sets - 10 reps ?- Seated Upper Trapezius Stretch  - 1 x daily - 7 x weekly - 1 sets - 3 reps - 20 hold ?- Seated Cervical Retraction  - 1 x daily - 7 x weekly - 1 sets - 10 reps ?- Seated Scapular Retraction  - 1 x daily - 7 x weekly - 1 sets - 10  reps ?- Supine Shoulder Horizontal Abduction with Resistance  - 2 x daily - 7 x weekly - 2 sets - 10 reps ?- Supine Bilateral Shoulder External Rotation with Resistance  - 2 x daily - 7 x weekly - 2 sets - 10 reps ?- Supine PNF D2 Flexion with Resistance  - 2 x daily - 7 x weekly - 2 sets - 10 reps ? ?  ?PATIENT EDUCATION: 01/27/22 ?Education details: Access Code: [email or web address], DN info ?Person educated: Patient ?Education method: Explanation, Demonstration, and Handouts ?Education comprehension: verbalized understanding and returned demonstration ?  ?  ?HOME EXERCISE PROGRAM: ?Access Code: [email or web address] ?  ?ASSESSMENT: ?  ?CLINICAL IMPRESSION:  Pt denies any changes in jaw symptoms since the start of care.  Pt is independent and complaint in HEP for  neck and jaw.  Pt also has braces to improve occulsion and alignment.  Pt had braces adjusted today so requested less needling in the masseters today.  Pt with trigger points and good response to DN today with twitch and improved tissue mobility.  Pt with Rt>Lt cervical tension overall. Pt with tension and trigger points in rhomboids and subscapularis bilaterally and had good response to DN in this region today.  Pt with poor postural endurance and PT emphasized importance of postural corrections and initiation of a strength program such as pilates or barre at the beginner level.  Pt will continue to benefit from skilled PT to address neck and jaw pain.   ?  ?REHAB POTENTIAL: Good ?  ?CLINICAL DECISION MAKING: Stable/uncomplicated ?  ?EVALUATION COMPLEXITY: Low ?  ?  ?GOALS: ?Goals reviewed with patient? Yes ?  ?SHORT TERM GOALS: ?  ?STG Name Target Date Goal status  ?1 Pt will be ind with initial HEP without exacerbation of pain ?Baseline:  01/11/2022 MET  ?2   ?Baseline:       ?3   ?Baseline:      ?         ?         ?         ?         ?  ?LONG TERM GOALS:  ?  ?LTG Name Target Date Goal status  ?1 Pt will be ind with advanced HEP ?Baseline: 02/15/2022 MET  ?2 Pt will report at  least 50% reduced pain with chewing and opening mouth. ?Baseline: 02/15/2022 Not met  ?3 Pt will achieve UE and scapular strength of at least 4+/5 for improved postural support with dynamic tasks and static p

## 2022-02-10 ENCOUNTER — Encounter: Payer: Self-pay | Admitting: Physical Therapy

## 2022-02-18 ENCOUNTER — Encounter: Payer: Self-pay | Admitting: Neurology

## 2023-06-28 ENCOUNTER — Ambulatory Visit: Payer: Self-pay

## 2023-06-28 ENCOUNTER — Other Ambulatory Visit: Payer: Self-pay | Admitting: Nurse Practitioner

## 2023-06-28 DIAGNOSIS — S39012A Strain of muscle, fascia and tendon of lower back, initial encounter: Secondary | ICD-10-CM
